# Patient Record
Sex: Male | Born: 1985 | Race: White | Hispanic: No | Marital: Married | Smoking: Never smoker
Health system: Southern US, Community
[De-identification: ages and names within clinical notes are randomized; demographics above are authoritative.]

## PROBLEM LIST (undated history)

## (undated) DIAGNOSIS — F419 Anxiety disorder, unspecified: Secondary | ICD-10-CM

---

## 2014-09-21 ENCOUNTER — Encounter (HOSPITAL_BASED_OUTPATIENT_CLINIC_OR_DEPARTMENT_OTHER): Payer: Self-pay | Admitting: *Deleted

## 2014-09-21 ENCOUNTER — Emergency Department (HOSPITAL_BASED_OUTPATIENT_CLINIC_OR_DEPARTMENT_OTHER)
Admission: EM | Admit: 2014-09-21 | Discharge: 2014-09-21 | Disposition: A | Discharge status: Home / Self Care | Payer: 59 | Attending: Emergency Medicine | Admitting: Emergency Medicine

## 2014-09-21 ENCOUNTER — Emergency Department (HOSPITAL_BASED_OUTPATIENT_CLINIC_OR_DEPARTMENT_OTHER): Payer: 59

## 2014-09-21 DIAGNOSIS — F419 Anxiety disorder, unspecified: Secondary | ICD-10-CM | POA: Diagnosis not present

## 2014-09-21 DIAGNOSIS — Z880 Allergy status to penicillin: Secondary | ICD-10-CM | POA: Insufficient documentation

## 2014-09-21 DIAGNOSIS — Z79899 Other long term (current) drug therapy: Secondary | ICD-10-CM | POA: Insufficient documentation

## 2014-09-21 DIAGNOSIS — R079 Chest pain, unspecified: Secondary | ICD-10-CM | POA: Diagnosis present

## 2014-09-21 DIAGNOSIS — R0789 Other chest pain: Secondary | ICD-10-CM | POA: Diagnosis not present

## 2014-09-21 HISTORY — DX: Anxiety disorder, unspecified: F41.9

## 2014-09-21 LAB — BASIC METABOLIC PANEL
ANION GAP: 9 (ref 5–15)
BUN: 11 mg/dL (ref 6–20)
CALCIUM: 9.4 mg/dL (ref 8.9–10.3)
CO2: 29 mmol/L (ref 22–32)
Chloride: 103 mmol/L (ref 101–111)
Creatinine, Ser: 1.06 mg/dL (ref 0.61–1.24)
GFR calc Af Amer: >60 mL/min (ref >60)
GLUCOSE: 94 mg/dL (ref 65–99)
Potassium: 4.2 mmol/L (ref 3.5–5.1)
SODIUM: 141 mmol/L (ref 135–145)

## 2014-09-21 LAB — CBC
HCT: 43.7 % (ref 39.0–52.0)
Hemoglobin: 14.8 g/dL (ref 13.0–17.0)
MCH: 28.7 pg (ref 26.0–34.0)
MCHC: 33.9 g/dL (ref 30.0–36.0)
MCV: 84.9 fL (ref 78.0–100.0)
Platelets: 204 10*3/uL (ref 150–400)
RBC: 5.15 MIL/uL (ref 4.22–5.81)
RDW: 12.7 % (ref 11.5–15.5)
WBC: 8.1 10*3/uL (ref 4.0–10.5)

## 2014-09-21 LAB — TROPONIN I

## 2014-09-21 MED ORDER — IBUPROFEN 800 MG PO TABS: 800.0000 mg | ORAL_TABLET | Freq: Three times a day (TID) | ORAL | Status: AC

## 2014-09-21 MED ORDER — IBUPROFEN 800 MG PO TABS: 800.0000 mg | ORAL_TABLET | Freq: Once | ORAL | Status: DC

## 2014-09-21 NOTE — ED Notes (Signed)
PA at bedside.

## 2014-09-21 NOTE — ED Notes (Signed)
Pt c/o left sided chest pain denies n/v or SOB

## 2014-09-21 NOTE — Discharge Instructions (Signed)

## 2014-09-21 NOTE — ED Notes (Signed)
Patient transported to X-ray 

## 2014-09-21 NOTE — ED Provider Notes (Signed)
CSN: [id number]     Arrival date & time 09/21/14  1547 History   First MD Initiated Contact with Patient 09/21/14 [phone]     Chief Complaint  Patient presents with  . Chest Pain     (Consider location/radiation/quality/duration/timing/severity/associated sxs/prior Treatment) HPI Adrian Braun is a 29 year old male with past medical history of anxiety who presents the ER complaining of chest discomfort. Patient states since this morning around 10 AM he has had a "on and off chest cramping sensation" in his upper left chest. Patient reports periodically feeling some tingling sensation in his left hand also throughout the day. Patient states he was sitting down at work at a desk when his symptoms began this morning. Patient denies any aggravating or alleviating factors. He states when the discomfort is present is there for "a minute or 2" and then resolved spontaneously. Patient denies associated shortness of breath, headache, dizziness, blurred vision, weakness, palpitations, nausea, vomiting, abdominal pain, fever, recent illness, cough.  Past Medical History  Diagnosis Date  . Anxiety    History reviewed. No pertinent past surgical history. History reviewed. No pertinent family history. History  Substance Use Topics  . Smoking status: Never Smoker   . Smokeless tobacco: Not on file  . Alcohol Use: No    Review of Systems  Constitutional: Negative for fever.  HENT: Negative for trouble swallowing.   Eyes: Negative for visual disturbance.  Respiratory: Negative for shortness of breath.   Cardiovascular: Positive for chest pain.  Gastrointestinal: Negative for nausea, vomiting and abdominal pain.  Genitourinary: Negative for dysuria.  Musculoskeletal: Negative for neck pain.  Skin: Negative for rash.  Neurological: Negative for dizziness, weakness and numbness.  Psychiatric/Behavioral: Negative.       Allergies  Amoxicillin  Home Medications   Prior to Admission medications    Medication Sig Start Date End Date Taking? Authorizing Provider  busPIRone (BUSPAR) 30 MG tablet Take 35 mg by mouth 2 (two) times daily.   Yes Historical Provider, MD  ibuprofen (ADVIL,MOTRIN) 800 MG tablet Take 1 tablet (800 mg total) by mouth 3 (three) times daily. 09/21/14   Ladona MowJoe Fadia Marlar, PA-C  traZODone (DESYREL) 100 MG tablet Take 100 mg by mouth at bedtime.   Yes Historical Provider, MD   BP 121/80 mmHg  Pulse 67  Temp(Src) 98.3 F (36.8 C)  Resp 14  Ht 5\' 9"  (1.753 m)  Wt 255 lb (115.667 kg)  BMI 37.64 kg/m2  SpO2 99% Physical Exam  Constitutional: He is oriented to person, place, and time. He appears well-developed and well-nourished. No distress.  HENT:  Head: Normocephalic and atraumatic.  Mouth/Throat: Oropharynx is clear and moist. No oropharyngeal exudate.  Eyes: Right eye exhibits no discharge. Left eye exhibits no discharge. No scleral icterus.  Neck: Normal range of motion.  Cardiovascular: Normal rate, regular rhythm and normal heart sounds.   No murmur heard. Pulmonary/Chest: Effort normal and breath sounds normal. No respiratory distress.  Abdominal: Soft. There is no tenderness.  Musculoskeletal: Normal range of motion. He exhibits no edema or tenderness.  Neurological: He is alert and oriented to person, place, and time. He has normal strength. No cranial nerve deficit or sensory deficit. He displays a negative Romberg sign. Coordination normal. GCS eye subscore is 4. GCS verbal subscore is 5. GCS motor subscore is 6.  Patient fully alert, answering questions appropriately in full, clear sentences. Cranial nerves II through XII grossly intact. Motor strength 5 out of 5 in all major muscle groups of upper and  lower extremities. Distal sensation intact.   Skin: Skin is warm and dry. No rash noted. He is not diaphoretic.  Psychiatric: He has a normal mood and affect.  Nursing note and vitals reviewed.   ED Course  Procedures (including critical care time) Labs  Review Labs Reviewed  BASIC METABOLIC PANEL  CBC  TROPONIN I    Imaging Review Dg Chest 2 View  09/21/2014   CLINICAL DATA:  Left-sided chest pain.  EXAM: CHEST  2 VIEW  COMPARISON:  None.  FINDINGS: The heart size and mediastinal contours are within normal limits. Both lungs are clear. No pneumothorax or pleural effusion is noted. The visualized skeletal structures are unremarkable.  IMPRESSION: No active cardiopulmonary disease.   Electronically Signed   By: Lupita RaiderJames  Green Jr, M.D.   On: 09/21/2014 16:31     EKG Interpretation   Date/Time:  Monday Sep 21 2014 16:01:00 EDT Ventricular Rate:  59 PR Interval:  146 QRS Duration: 88 QT Interval:  396 QTC Calculation: 392 R Axis:   57 Text Interpretation:  Sinus bradycardia with sinus arrhythmia Otherwise  normal ECG AGREE Confirmed by Donnald GarrePfeiffer, MD, Lebron ConnersMarcy [id number]) on 09/21/2014  6:00:11 PM      MDM   Final diagnoses:  Atypical chest pain    Patient here describing atypical chest pain. Chest pain is not likely of cardiac or pulmonary etiology d/t presentation, perc negative, VSS, no tracheal deviation, no JVD or new murmur, RRR, breath sounds equal bilaterally, EKG without acute abnormalities, negative troponin, and negative CXR. Patient hematuria and stable throughout ER stay. Offered pain management, patient declined stating his pain is not severe, and is tolerable. Pt has been advised  return to the ED if CP becomes exertional, associated with diaphoresis or nausea, radiates to left jaw/arm, worsens or becomes concerning in any way. Pt appears reliable for follow up and is agreeable to discharge.   BP 121/80 mmHg  Pulse 67  Temp(Src) 98.3 F (36.8 C)  Resp 14  Ht 5\' 9"  (1.753 m)  Wt 255 lb (115.667 kg)  BMI 37.64 kg/m2  SpO2 99%  Signed,  Ladona MowJoe Shayle Donahoo, PA-C 5:54 PM  Patient discussed with Dr. Arby BarretteMarcy Pfeiffer, M.D.   Ladona MowJoe Sabian Kuba, PA-C 09/22/14 1754  Arby BarretteMarcy Pfeiffer, MD 09/24/14 [phone]

## 2016-11-14 IMAGING — DX DG CHEST 2V
2 series · 2 of 2 positions shown · non-contrast
Comparison: None.

CLINICAL DATA: Left-sided chest pain.

EXAM:
CHEST  2 VIEW

[chest pa]
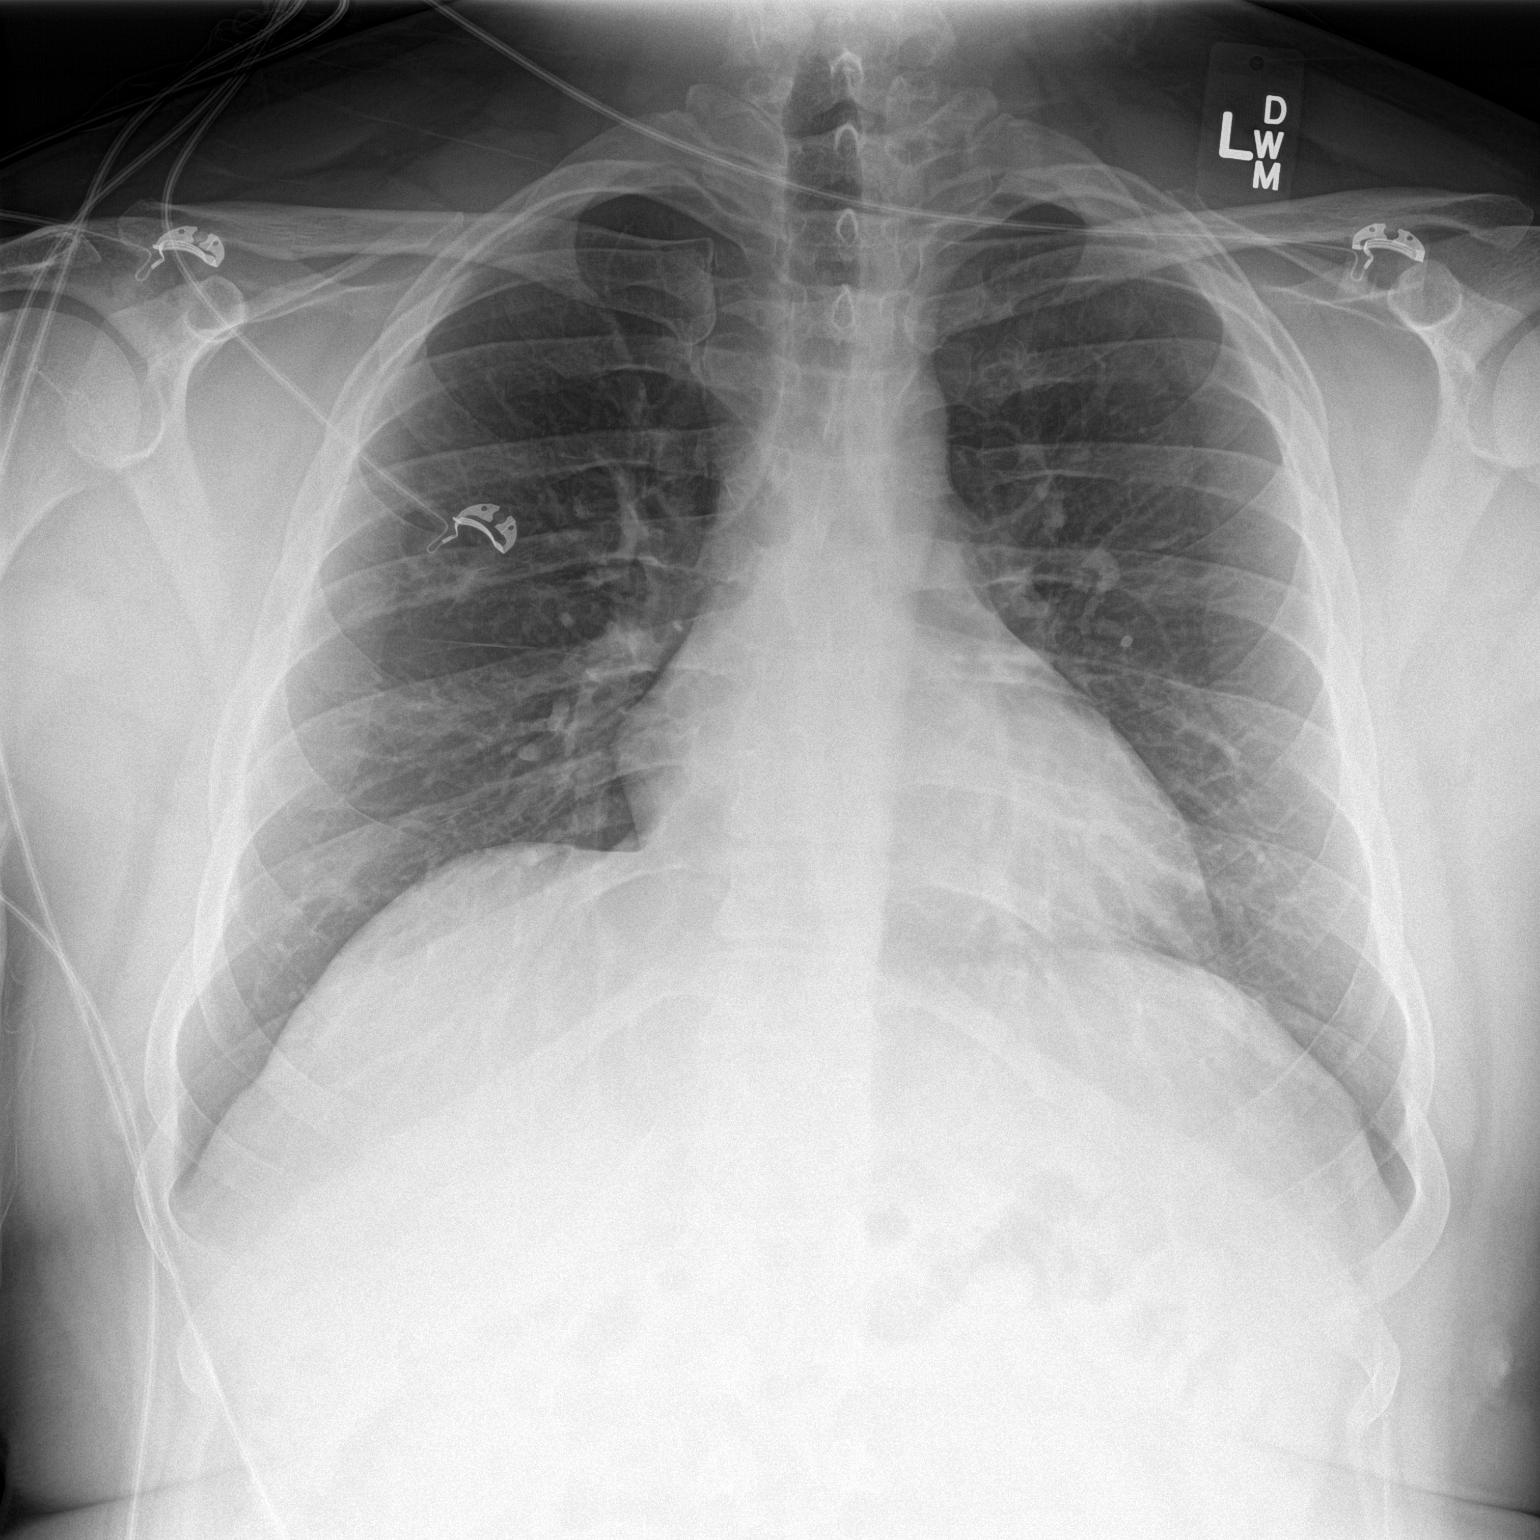

[chest lat]
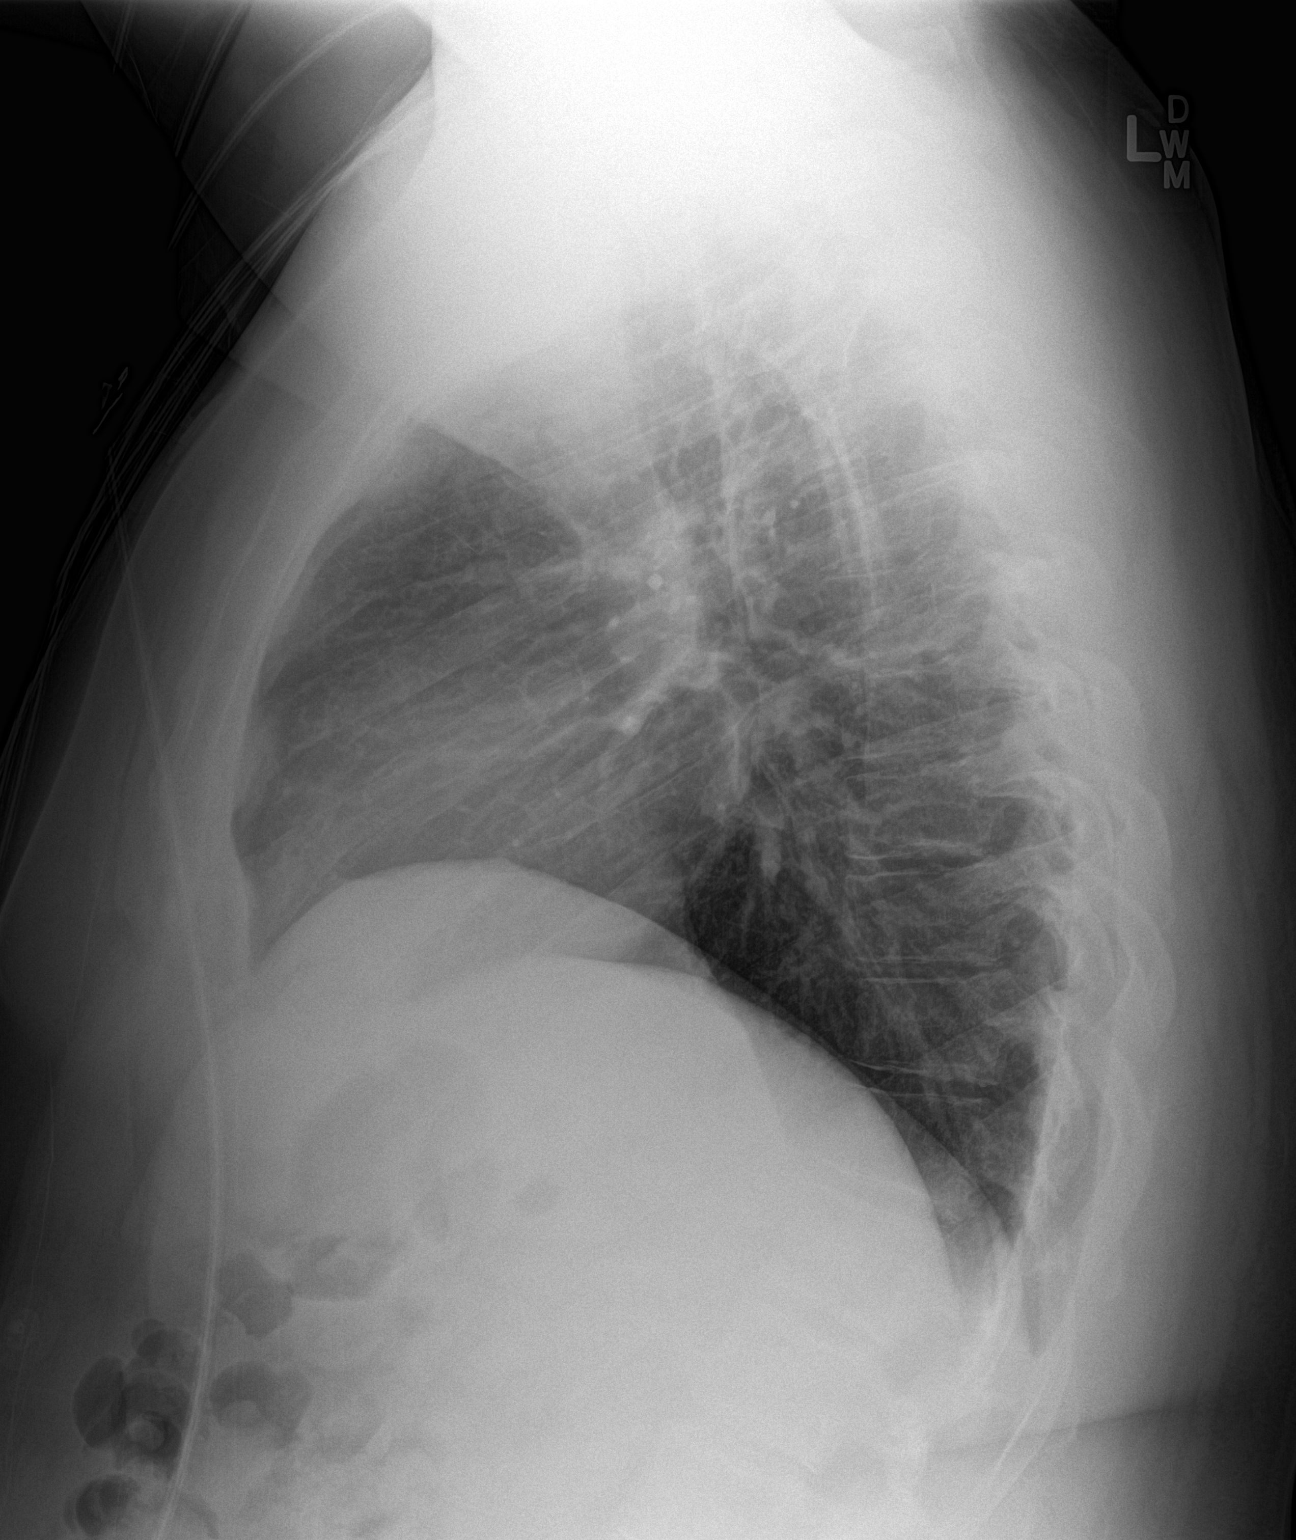

[2 of 2 positions shown; findings below may reference images not displayed]

FINDINGS: The heart size and mediastinal contours are within normal limits.
Both lungs are clear. No pneumothorax or pleural effusion is noted.
The visualized skeletal structures are unremarkable.
IMPRESSION: No active cardiopulmonary disease.

## 2021-03-18 ENCOUNTER — Other Ambulatory Visit: Payer: Self-pay

## 2021-03-18 ENCOUNTER — Ambulatory Visit: Payer: 59 | Attending: Obstetrics and Gynecology | Admitting: *Deleted

## 2021-05-06 ENCOUNTER — Other Ambulatory Visit: Payer: Self-pay

## 2022-04-05 ENCOUNTER — Other Ambulatory Visit (HOSPITAL_COMMUNITY): Payer: Self-pay

## 2022-04-05 MED ORDER — SAXENDA 18 MG/3ML ~~LOC~~ SOPN
3.0000 mg | PEN_INJECTOR | Freq: Every day | SUBCUTANEOUS | 0 refills | Status: DC
  Filled 2022-04-05 – 2022-04-11 (×3): qty 15, 30d supply, fill #0

## 2022-04-10 ENCOUNTER — Other Ambulatory Visit (HOSPITAL_COMMUNITY): Payer: Self-pay

## 2022-04-11 ENCOUNTER — Other Ambulatory Visit (HOSPITAL_COMMUNITY): Payer: Self-pay

## 2022-04-12 ENCOUNTER — Other Ambulatory Visit (HOSPITAL_COMMUNITY): Payer: Self-pay

## 2022-04-19 ENCOUNTER — Other Ambulatory Visit (HOSPITAL_COMMUNITY): Payer: Self-pay

## 2022-04-20 ENCOUNTER — Other Ambulatory Visit (HOSPITAL_COMMUNITY): Payer: Self-pay

## 2022-04-20 MED ORDER — LISDEXAMFETAMINE DIMESYLATE 40 MG PO CAPS
40.0000 mg | ORAL_CAPSULE | Freq: Every morning | ORAL | 0 refills | Status: AC
  Filled 2022-04-20: qty 30, 30d supply, fill #0

## 2022-05-15 ENCOUNTER — Other Ambulatory Visit (HOSPITAL_COMMUNITY): Payer: Self-pay

## 2022-05-15 MED ORDER — SAXENDA 18 MG/3ML ~~LOC~~ SOPN
3.0000 mg | PEN_INJECTOR | Freq: Every day | SUBCUTANEOUS | 0 refills | Status: AC
  Filled 2022-05-15: qty 15, 30d supply, fill #0

## 2022-05-15 MED ORDER — LISDEXAMFETAMINE DIMESYLATE 40 MG PO CAPS
ORAL_CAPSULE | ORAL | 0 refills | Status: AC
  Filled 2022-05-15: qty 30, 30d supply, fill #0

## 2022-05-20 ENCOUNTER — Other Ambulatory Visit (HOSPITAL_COMMUNITY): Payer: Self-pay

## 2022-05-22 ENCOUNTER — Other Ambulatory Visit (HOSPITAL_COMMUNITY): Payer: Self-pay

## 2022-05-23 ENCOUNTER — Other Ambulatory Visit (HOSPITAL_COMMUNITY): Payer: Self-pay

## 2022-05-26 ENCOUNTER — Other Ambulatory Visit (HOSPITAL_COMMUNITY): Payer: Self-pay

## 2022-05-27 ENCOUNTER — Other Ambulatory Visit (HOSPITAL_COMMUNITY): Payer: Self-pay

## 2022-05-31 ENCOUNTER — Other Ambulatory Visit (HOSPITAL_COMMUNITY): Payer: Self-pay

## 2022-06-01 ENCOUNTER — Other Ambulatory Visit (HOSPITAL_COMMUNITY): Payer: Self-pay

## 2022-06-01 MED ORDER — WEGOVY 2.4 MG/0.75ML ~~LOC~~ SOAJ
SUBCUTANEOUS | 0 refills | Status: DC
  Filled 2022-06-01: qty 3, 28d supply, fill #0

## 2022-06-03 ENCOUNTER — Other Ambulatory Visit (HOSPITAL_COMMUNITY): Payer: Self-pay

## 2022-06-04 ENCOUNTER — Ambulatory Visit
Admission: EM | Admit: 2022-06-04 | Discharge: 2022-06-04 | Disposition: A | Discharge status: Home / Self Care | Payer: BC Managed Care – PPO | Attending: Nurse Practitioner | Admitting: Nurse Practitioner

## 2022-06-04 ENCOUNTER — Encounter: Payer: Self-pay | Admitting: Emergency Medicine

## 2022-06-04 DIAGNOSIS — R112 Nausea with vomiting, unspecified: Secondary | ICD-10-CM | POA: Diagnosis not present

## 2022-06-04 DIAGNOSIS — T50905A Adverse effect of unspecified drugs, medicaments and biological substances, initial encounter: Secondary | ICD-10-CM | POA: Diagnosis not present

## 2022-06-04 MED ORDER — ONDANSETRON 8 MG PO TBDP: 8.0000 mg | ORAL_TABLET | Freq: Three times a day (TID) | ORAL | 0 refills | Status: AC | PRN

## 2022-06-04 MED ORDER — ONDANSETRON 8 MG PO TBDP
8.0000 mg | ORAL_TABLET | Freq: Once | ORAL | Status: AC
  Administered 2022-06-04: 8 mg via ORAL

## 2022-06-04 NOTE — Discharge Instructions (Signed)
Zofran as needed for nausea/vomiting Please contact your prescriber of your Wegovy to discuss with them your side effect of nausea and vomiting and additional medication management Please go to the emergency room if you are unable to stay hydrated or have any worsening symptoms

## 2022-06-04 NOTE — ED Provider Notes (Signed)
[facility]    CSN: [id number] Arrival date & time: 06/04/22  1426      History   Chief Complaint Chief Complaint  Patient presents with   Emesis    HPI Adrian Braun is a 37 y.o. male presents for evaluation of nausea/vomiting/side effect of the medication.  Patient has been on Saxenda for some time and was recently changed to [facility].  He took his first injection of Wegovy yesterday at 2.4 mg.  He states yesterday he began to have nausea and vomiting and has had several episodes since that time.  He has been unable to keep any food or fluids down.  Denies any fevers, chills, body aches, URI symptoms.  He does have a message out to his prescriber regarding this but has since the weekend does not feel he will hear from them until tomorrow.  Denies any history of GI diagnoses or surgeries in the past.  He has not used any OTC medications.  No other concerns at this time.   Emesis   Past Medical History:  Diagnosis Date   Anxiety     There are no problems to display for this patient.   History reviewed. No pertinent surgical history.     Home Medications    Prior to Admission medications   Medication Sig Start Date End Date Taking? Authorizing Provider  busPIRone (BUSPAR) 30 MG tablet Take 35 mg by mouth 2 (two) times daily.   Yes [provider]  ibuprofen (ADVIL,MOTRIN) 800 MG tablet Take 1 tablet (800 mg total) by mouth 3 (three) times daily. 09/21/14  Yes Dahlia Bailiff, PA-C  lisdexamfetamine (VYVANSE) 40 MG capsule Take 1 capsule (40 mg total) by mouth every morning. 04/20/22  Yes   lisdexamfetamine (VYVANSE) 40 MG capsule Take 1 capsule by mouth daily in the morning 05/15/22  Yes   ondansetron (ZOFRAN-ODT) 8 MG disintegrating tablet Take 1 tablet (8 mg total) by mouth every 8 (eight) hours as needed for nausea or vomiting. 06/04/22  Yes Melynda Ripple, NP  traZODone (DESYREL) 100 MG tablet Take 100 mg by mouth at bedtime.   Yes [provider]   WEGOVY 2.4 MG/0.75ML SOAJ Inject 2.4 mg into the skin once a week. 05/31/22  Yes   Liraglutide -Weight Management (SAXENDA) 18 MG/3ML SOPN Inject 3 mg into the skin daily. 05/15/22       Family History History reviewed. No pertinent family history.  Social History Social History   Tobacco Use   Smoking status: Never   Smokeless tobacco: Never  Vaping Use   Vaping Use: Never used  Substance Use Topics   Alcohol use: Yes   Drug use: No     Allergies   Amoxicillin   Review of Systems Review of Systems  Gastrointestinal:  Positive for nausea and vomiting.     Physical Exam Triage Vital Signs ED Triage Vitals  Enc Vitals Group     BP 06/04/22 1449 119/81     Pulse Rate 06/04/22 1449 77     Resp 06/04/22 1449 18     Temp 06/04/22 1449 98.8 F (37.1 C)     Temp Source 06/04/22 1449 Oral     SpO2 06/04/22 1449 96 %     Weight 06/04/22 1450 287 lb (130.2 kg)     Height 06/04/22 1450 5\' 9"  (1.753 m)     Head Circumference --      Peak Flow --      Pain Score 06/04/22  1450 0     Pain Loc --      Pain Edu? --      Excl. in [place]? --    No data found.  Updated Vital Signs BP 119/81 (BP Location: Left Arm)   Pulse 77   Temp 98.8 F (37.1 C) (Oral)   Resp 18   Ht 5\' 9"  (1.753 m)   Wt 287 lb (130.2 kg)   SpO2 96%   BMI 42.38 kg/m   Visual Acuity Right Eye Distance:   Left Eye Distance:   Bilateral Distance:    Right Eye Near:   Left Eye Near:    Bilateral Near:     Physical Exam Vitals and nursing note reviewed.  Constitutional:      Appearance: Normal appearance.  HENT:     Head: Normocephalic and atraumatic.  Eyes:     Pupils: Pupils are equal, round, and reactive to light.  Cardiovascular:     Rate and Rhythm: Normal rate.  Pulmonary:     Effort: Pulmonary effort is normal.  Abdominal:     General: Bowel sounds are normal. There is no distension.     Palpations: Abdomen is soft.     Tenderness: There is no abdominal tenderness. There is no  guarding.  Skin:    General: Skin is warm and dry.  Neurological:     General: No focal deficit present.     Mental Status: He is alert and oriented to person, place, and time.  Psychiatric:        Mood and Affect: Mood normal.        Behavior: Behavior normal.      UC Treatments / Results  Labs (all labs ordered are listed, but only abnormal results are displayed) Labs Reviewed - No data to display  EKG   Radiology No results found.  Procedures Procedures (including critical care time)  Medications Ordered in UC Medications  ondansetron (ZOFRAN-ODT) disintegrating tablet 8 mg (has no administration in time range)    Initial Impression / Assessment and Plan / UC Course  I have reviewed the triage vital signs and the nursing notes.  Pertinent labs & imaging results that were available during my care of the patient were reviewed by me and considered in my medical decision making (see chart for details).    Reviewed exam and symptoms with patient.  No red flags on exam. Patient given Zofran in clinic for nausea/vomiting Rx Zofran sent to pharmacy Patient will follow-up with the prescriber of his [facility] tomorrow to discuss medication management due to side effects Encouraged fluids ER precautions reviewed and patient verbalized understanding Final Clinical Impressions(s) / UC Diagnoses   Final diagnoses:  Nausea and vomiting, unspecified vomiting type  Medication side effect, initial encounter     Discharge Instructions      Zofran as needed for nausea/vomiting Please contact your prescriber of your Wegovy to discuss with them your side effect of nausea and vomiting and additional medication management Please go to the emergency room if you are unable to stay hydrated or have any worsening symptoms   ED Prescriptions     Medication Sig Dispense Auth. Provider   ondansetron (ZOFRAN-ODT) 8 MG disintegrating tablet Take 1 tablet (8 mg total) by mouth every 8  (eight) hours as needed for nausea or vomiting. 15 tablet Melynda Ripple, NP      PDMP not reviewed this encounter.   Melynda Ripple, NP 06/04/22 1504

## 2022-06-04 NOTE — ED Triage Notes (Signed)
Patient recently started on Wegovy 2.4mg  yesterday.  Started vomiting yesterday, unable to keep fluids down.

## 2022-06-22 ENCOUNTER — Other Ambulatory Visit (HOSPITAL_COMMUNITY): Payer: Self-pay

## 2022-06-23 ENCOUNTER — Other Ambulatory Visit (HOSPITAL_COMMUNITY): Payer: Self-pay

## 2022-06-23 MED ORDER — LISDEXAMFETAMINE DIMESYLATE 40 MG PO CAPS
40.0000 mg | ORAL_CAPSULE | Freq: Every morning | ORAL | 0 refills | Status: AC
  Filled 2022-06-23: qty 30, 30d supply, fill #0

## 2022-06-25 ENCOUNTER — Other Ambulatory Visit (HOSPITAL_COMMUNITY): Payer: Self-pay

## 2022-06-26 ENCOUNTER — Other Ambulatory Visit (HOSPITAL_COMMUNITY): Payer: Self-pay

## 2022-06-26 MED ORDER — LISDEXAMFETAMINE DIMESYLATE 40 MG PO CAPS
40.0000 mg | ORAL_CAPSULE | Freq: Every morning | ORAL | 0 refills | Status: DC
  Filled 2022-09-25: qty 30, 30d supply, fill #0

## 2022-06-26 MED ORDER — LISDEXAMFETAMINE DIMESYLATE 40 MG PO CAPS
40.0000 mg | ORAL_CAPSULE | Freq: Every morning | ORAL | 0 refills | Status: AC
  Filled 2022-07-22 (×2): qty 30, 30d supply, fill #0

## 2022-06-26 MED ORDER — WEGOVY 2.4 MG/0.75ML ~~LOC~~ SOAJ
SUBCUTANEOUS | 0 refills | Status: DC
  Filled 2022-06-26: qty 3, 28d supply, fill #0

## 2022-06-27 ENCOUNTER — Other Ambulatory Visit (HOSPITAL_COMMUNITY): Payer: Self-pay

## 2022-07-21 ENCOUNTER — Other Ambulatory Visit (HOSPITAL_COMMUNITY): Payer: Self-pay

## 2022-07-21 MED ORDER — WEGOVY 2.4 MG/0.75ML ~~LOC~~ SOAJ
2.4000 mg | SUBCUTANEOUS | 0 refills | Status: AC
  Filled 2022-07-21: qty 3, 28d supply, fill #0

## 2022-07-22 ENCOUNTER — Other Ambulatory Visit (HOSPITAL_COMMUNITY): Payer: Self-pay

## 2022-07-24 ENCOUNTER — Other Ambulatory Visit (HOSPITAL_COMMUNITY): Payer: Self-pay

## 2022-07-24 MED ORDER — LISDEXAMFETAMINE DIMESYLATE 40 MG PO CAPS
40.0000 mg | ORAL_CAPSULE | Freq: Every morning | ORAL | 0 refills | Status: AC
  Filled 2022-08-21: qty 30, 30d supply, fill #0

## 2022-08-21 ENCOUNTER — Other Ambulatory Visit (HOSPITAL_COMMUNITY): Payer: Self-pay

## 2022-08-24 ENCOUNTER — Other Ambulatory Visit (HOSPITAL_COMMUNITY): Payer: Self-pay

## 2022-09-06 ENCOUNTER — Other Ambulatory Visit (HOSPITAL_COMMUNITY): Payer: Self-pay

## 2022-09-25 ENCOUNTER — Other Ambulatory Visit: Payer: Self-pay

## 2022-09-25 ENCOUNTER — Other Ambulatory Visit (HOSPITAL_COMMUNITY): Payer: Self-pay

## 2022-09-26 ENCOUNTER — Other Ambulatory Visit: Payer: Self-pay

## 2022-10-03 ENCOUNTER — Other Ambulatory Visit (HOSPITAL_COMMUNITY): Payer: Self-pay

## 2022-10-03 MED ORDER — LISDEXAMFETAMINE DIMESYLATE 40 MG PO CAPS
ORAL_CAPSULE | ORAL | 0 refills | Status: DC
  Filled 2022-10-03 – 2022-10-28 (×2): qty 30, 30d supply, fill #0

## 2022-10-28 ENCOUNTER — Other Ambulatory Visit (HOSPITAL_COMMUNITY): Payer: Self-pay

## 2022-11-03 ENCOUNTER — Other Ambulatory Visit (HOSPITAL_COMMUNITY): Payer: Self-pay

## 2022-11-03 ENCOUNTER — Other Ambulatory Visit: Payer: Self-pay

## 2022-11-04 ENCOUNTER — Other Ambulatory Visit (HOSPITAL_COMMUNITY): Payer: Self-pay

## 2022-11-06 ENCOUNTER — Other Ambulatory Visit (HOSPITAL_COMMUNITY): Payer: Self-pay

## 2022-11-06 MED ORDER — LISDEXAMFETAMINE DIMESYLATE 40 MG PO CAPS
40.0000 mg | ORAL_CAPSULE | Freq: Every morning | ORAL | 0 refills | Status: AC
  Filled 2022-11-06 – 2022-12-05 (×2): qty 30, 30d supply, fill #0

## 2022-11-06 MED ORDER — LISDEXAMFETAMINE DIMESYLATE 40 MG PO CAPS
40.0000 mg | ORAL_CAPSULE | Freq: Every morning | ORAL | 0 refills | Status: AC
  Filled 2023-04-25: qty 30, 30d supply, fill #0

## 2022-11-06 MED ORDER — LISDEXAMFETAMINE DIMESYLATE 40 MG PO CAPS
40.0000 mg | ORAL_CAPSULE | Freq: Every morning | ORAL | 0 refills | Status: AC
  Filled 2023-01-05: qty 30, 30d supply, fill #0

## 2022-11-07 ENCOUNTER — Other Ambulatory Visit (HOSPITAL_COMMUNITY): Payer: Self-pay

## 2022-12-05 ENCOUNTER — Other Ambulatory Visit: Payer: Self-pay

## 2022-12-05 ENCOUNTER — Other Ambulatory Visit (HOSPITAL_COMMUNITY): Payer: Self-pay

## 2023-01-05 ENCOUNTER — Other Ambulatory Visit (HOSPITAL_COMMUNITY): Payer: Self-pay

## 2023-01-06 ENCOUNTER — Other Ambulatory Visit (HOSPITAL_COMMUNITY): Payer: Self-pay

## 2023-01-06 MED ORDER — LISDEXAMFETAMINE DIMESYLATE 40 MG PO CAPS
40.0000 mg | ORAL_CAPSULE | Freq: Every morning | ORAL | 0 refills | Status: AC
  Filled 2023-01-06 – 2023-02-08 (×2): qty 30, 30d supply, fill #0

## 2023-01-09 ENCOUNTER — Other Ambulatory Visit (HOSPITAL_COMMUNITY): Payer: Self-pay

## 2023-02-08 ENCOUNTER — Other Ambulatory Visit (HOSPITAL_COMMUNITY): Payer: Self-pay

## 2023-02-08 ENCOUNTER — Other Ambulatory Visit: Payer: Self-pay

## 2023-02-13 ENCOUNTER — Other Ambulatory Visit (HOSPITAL_COMMUNITY): Payer: Self-pay

## 2023-03-06 ENCOUNTER — Other Ambulatory Visit (HOSPITAL_COMMUNITY): Payer: Self-pay

## 2023-03-06 MED ORDER — LISDEXAMFETAMINE DIMESYLATE 40 MG PO CAPS
40.0000 mg | ORAL_CAPSULE | ORAL | 0 refills | Status: AC
  Filled 2023-03-06 – 2023-03-22 (×3): qty 30, 30d supply, fill #0

## 2023-03-08 ENCOUNTER — Other Ambulatory Visit: Payer: Self-pay

## 2023-03-08 ENCOUNTER — Other Ambulatory Visit (HOSPITAL_COMMUNITY): Payer: Self-pay

## 2023-03-19 ENCOUNTER — Other Ambulatory Visit (HOSPITAL_COMMUNITY): Payer: Self-pay

## 2023-03-22 ENCOUNTER — Other Ambulatory Visit (HOSPITAL_COMMUNITY): Payer: Self-pay

## 2023-04-25 ENCOUNTER — Other Ambulatory Visit (HOSPITAL_COMMUNITY): Payer: Self-pay

## 2023-04-27 ENCOUNTER — Other Ambulatory Visit (HOSPITAL_COMMUNITY): Payer: Self-pay

## 2023-05-07 ENCOUNTER — Other Ambulatory Visit (HOSPITAL_COMMUNITY): Payer: Self-pay

## 2023-05-07 MED ORDER — LISDEXAMFETAMINE DIMESYLATE 40 MG PO CAPS
40.0000 mg | ORAL_CAPSULE | Freq: Every morning | ORAL | 0 refills | Status: AC
  Filled 2023-07-03: qty 30, 30d supply, fill #0

## 2023-05-07 MED ORDER — LISDEXAMFETAMINE DIMESYLATE 40 MG PO CAPS
40.0000 mg | ORAL_CAPSULE | Freq: Every morning | ORAL | 0 refills | Status: AC
  Filled 2023-05-07 – 2023-05-31 (×2): qty 30, 30d supply, fill #0

## 2023-05-31 ENCOUNTER — Other Ambulatory Visit (HOSPITAL_COMMUNITY): Payer: Self-pay

## 2023-06-02 ENCOUNTER — Other Ambulatory Visit (HOSPITAL_COMMUNITY): Payer: Self-pay

## 2023-07-03 ENCOUNTER — Other Ambulatory Visit (HOSPITAL_COMMUNITY): Payer: Self-pay

## 2023-08-03 ENCOUNTER — Other Ambulatory Visit (HOSPITAL_COMMUNITY): Payer: Self-pay

## 2023-08-03 MED ORDER — LISDEXAMFETAMINE DIMESYLATE 40 MG PO CAPS
40.0000 mg | ORAL_CAPSULE | Freq: Every morning | ORAL | 0 refills | Status: AC
  Filled 2023-08-03: qty 30, 30d supply, fill #0

## 2023-08-04 ENCOUNTER — Other Ambulatory Visit (HOSPITAL_COMMUNITY): Payer: Self-pay

## 2023-08-30 ENCOUNTER — Other Ambulatory Visit (HOSPITAL_COMMUNITY): Payer: Self-pay
